# Patient Record
Sex: Female | Born: 2000 | Hispanic: Yes | Marital: Single | Smoking: Never smoker
Health system: Southern US, Community
[De-identification: ages and names within clinical notes are randomized; demographics above are authoritative.]

---
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
## 2022-02-14 ENCOUNTER — Emergency Department
Admission: EM | Admit: 2022-02-14 | Discharge: 2022-02-14 | Disposition: A | Discharge status: Home / Self Care | Payer: Medicaid Other | Attending: Emergency Medicine | Admitting: Emergency Medicine

## 2022-02-14 ENCOUNTER — Encounter: Payer: Self-pay | Admitting: Medical Oncology

## 2022-02-14 ENCOUNTER — Other Ambulatory Visit: Payer: Self-pay

## 2022-02-14 DIAGNOSIS — R112 Nausea with vomiting, unspecified: Secondary | ICD-10-CM | POA: Diagnosis present

## 2022-02-14 DIAGNOSIS — R1013 Epigastric pain: Secondary | ICD-10-CM | POA: Diagnosis not present

## 2022-02-14 LAB — URINALYSIS, ROUTINE W REFLEX MICROSCOPIC
Glucose, UA: NEGATIVE mg/dL
Ketones, ur: 20 mg/dL — AB
Nitrite: NEGATIVE
Protein, ur: 100 mg/dL — AB
Specific Gravity, Urine: 1.03 (ref 1.005–1.030)
pH: 7 (ref 5.0–8.0)

## 2022-02-14 LAB — COMPREHENSIVE METABOLIC PANEL
ALT: 14 U/L (ref 0–44)
AST: 25 U/L (ref 15–41)
Albumin: 5.7 g/dL — ABNORMAL HIGH (ref 3.5–5.0)
Alkaline Phosphatase: 77 U/L (ref 38–126)
Anion gap: 11 (ref 5–15)
BUN: 25 mg/dL — ABNORMAL HIGH (ref 6–20)
CO2: 31 mmol/L (ref 22–32)
Calcium: 10 mg/dL (ref 8.9–10.3)
Chloride: 95 mmol/L — ABNORMAL LOW (ref 98–111)
Creatinine, Ser: 0.81 mg/dL (ref 0.44–1.00)
GFR, Estimated: >60 mL/min (ref >60)
Glucose, Bld: 144 mg/dL — ABNORMAL HIGH (ref 70–99)
Potassium: 3.1 mmol/L — ABNORMAL LOW (ref 3.5–5.1)
Sodium: 137 mmol/L (ref 135–145)
Total Bilirubin: 0.8 mg/dL (ref 0.3–1.2)
Total Protein: 8.9 g/dL — ABNORMAL HIGH (ref 6.5–8.1)

## 2022-02-14 LAB — CBC
HCT: 46 % (ref 36.0–46.0)
Hemoglobin: 15.5 g/dL — ABNORMAL HIGH (ref 12.0–15.0)
MCH: 28.7 pg (ref 26.0–34.0)
MCHC: 33.7 g/dL (ref 30.0–36.0)
MCV: 85.2 fL (ref 80.0–100.0)
Platelets: 337 10*3/uL (ref 150–400)
RBC: 5.4 MIL/uL — ABNORMAL HIGH (ref 3.87–5.11)
RDW: 13.3 % (ref 11.5–15.5)
WBC: 8.8 10*3/uL (ref 4.0–10.5)
nRBC: 0 % (ref 0.0–0.2)

## 2022-02-14 LAB — PREGNANCY, URINE: Preg Test, Ur: NEGATIVE

## 2022-02-14 LAB — LIPASE, BLOOD: Lipase: 27 U/L (ref 11–51)

## 2022-02-14 MED ORDER — DIPHENHYDRAMINE HCL 50 MG/ML IJ SOLN
25.0000 mg | Freq: Once | INTRAMUSCULAR | Status: AC
  Administered 2022-02-14: 25 mg via INTRAVENOUS
  Filled 2022-02-14: qty 1

## 2022-02-14 MED ORDER — METOCLOPRAMIDE HCL 5 MG/ML IJ SOLN
10.0000 mg | Freq: Once | INTRAMUSCULAR | Status: AC
  Administered 2022-02-14: 10 mg via INTRAVENOUS
  Filled 2022-02-14: qty 2

## 2022-02-14 MED ORDER — ONDANSETRON 4 MG PO TBDP
4.0000 mg | ORAL_TABLET | Freq: Once | ORAL | Status: AC | PRN
  Administered 2022-02-14: 4 mg via ORAL
  Filled 2022-02-14: qty 1

## 2022-02-14 MED ORDER — SODIUM CHLORIDE 0.9 % IV BOLUS
1000.0000 mL | Freq: Once | INTRAVENOUS | Status: AC
  Administered 2022-02-14: 1000 mL via INTRAVENOUS

## 2022-02-14 MED ORDER — ONDANSETRON 4 MG PO TBDP: 4.0000 mg | ORAL_TABLET | Freq: Three times a day (TID) | ORAL | 0 refills | Status: AC | PRN

## 2022-02-14 NOTE — ED Provider Notes (Signed)
[facility] Provider Note    Event Date/Time   First MD Initiated Contact with Patient 02/14/22 [phone]     (approximate)  History   Chief Complaint: Vomiting  HPI  Kimberly Mccarty is a 21 y.o. female with no significant past medical history presents to the emergency department for nausea and vomiting.  According to the patient since Saturday she has been nauseated with frequent episodes of vomiting.  Patient states this has been an ongoing issue for her over the past 3 years intermittently.  Patient does admit to marijuana use although states she has been cutting back.  States mild epigastric discomfort which she relates to the vomiting.  No fever.  No urinary symptoms.  Physical Exam   Triage Vital Signs: ED Triage Vitals  Enc Vitals Group     BP 02/14/22 0921 (!) 132/97     Pulse Rate 02/14/22 0921 (!) 102     Resp 02/14/22 0921 17     Temp 02/14/22 0921 97.7 F (36.5 C)     Temp Source 02/14/22 0921 Oral     SpO2 02/14/22 0921 98 %     Weight 02/14/22 0922 85 lb (38.6 kg)     Height 02/14/22 0922 5' (1.524 m)     Head Circumference --      Peak Flow --      Pain Score 02/14/22 0921 0     Pain Loc --      Pain Edu? --      Excl. in [place]? --     Most recent vital signs: Vitals:   02/14/22 0921 02/14/22 0932  BP: (!) 132/97 (!) 126/99  Pulse: (!) 102   Resp: 17   Temp: 97.7 F (36.5 C) 98.2 F (36.8 C)  SpO2: 98%     General: Awake, no distress.  CV:  Good peripheral perfusion.  Regular rate and rhythm  Resp:  Normal effort.  Equal breath sounds bilaterally.  Abd:  No distention.  Soft, slight epigastric tenderness otherwise benign abdomen without rebound or guarding.   ED Results / Procedures / Treatments    MEDICATIONS ORDERED IN ED: Medications  sodium chloride 0.9 % bolus 1,000 mL (has no administration in time range)  metoCLOPramide (REGLAN) injection 10 mg (has no administration in time range)  diphenhydrAMINE (BENADRYL)  injection 25 mg (has no administration in time range)  ondansetron (ZOFRAN-ODT) disintegrating tablet 4 mg (4 mg Oral Given 02/14/22 0926)     IMPRESSION / MDM / ASSESSMENT AND PLAN / ED COURSE  I reviewed the triage vital signs and the nursing notes.  Patient's presentation is most consistent with acute presentation with potential threat to life or bodily function.  Patient presents emergency department for nausea and vomiting over the past 3 days.  Patient states a history of similar episodes intermittently over the past 3 years.  Patient does admit to occasional marijuana usage possibly indicating more of a cannabinoid induced or cyclical vomiting syndrome.  We will check labs we will continue to closely monitor in the emergency department.  We will treat with Reglan, IV fluids and Benadryl and reassess.  Patient agreeable to plan of care.  Patient's work-up is overall reassuring, reassuring CBC with a normal white blood cell count, reassuring chemistry, urinalysis does not appear consistent with UTI, pregnancy is negative, lipase is negative.  Patient is feeling better she has tolerated drinking fluids.  We will discharge patient with Zofran and have the patient follow-up with her  doctor.  FINAL CLINICAL IMPRESSION(S) / ED DIAGNOSES   Nausea and vomiting    Note:  This document was prepared using Dragon voice recognition software and may include unintentional dictation errors.   Harvest Dark, MD 02/14/22 1219

## 2022-02-14 NOTE — ED Triage Notes (Signed)
Pt reports that she has been having nausea and vomiting without any other sx's since Saturday.

## 2023-11-08 ENCOUNTER — Emergency Department
Admission: EM | Admit: 2023-11-08 | Discharge: 2023-11-08 | Disposition: A | Discharge status: Home / Self Care | Payer: Self-pay | Attending: Emergency Medicine | Admitting: Emergency Medicine

## 2023-11-08 ENCOUNTER — Encounter: Payer: Self-pay | Admitting: *Deleted

## 2023-11-08 ENCOUNTER — Other Ambulatory Visit: Payer: Self-pay

## 2023-11-08 DIAGNOSIS — E860 Dehydration: Secondary | ICD-10-CM | POA: Insufficient documentation

## 2023-11-08 DIAGNOSIS — R109 Unspecified abdominal pain: Secondary | ICD-10-CM | POA: Insufficient documentation

## 2023-11-08 DIAGNOSIS — R112 Nausea with vomiting, unspecified: Secondary | ICD-10-CM | POA: Insufficient documentation

## 2023-11-08 DIAGNOSIS — E876 Hypokalemia: Secondary | ICD-10-CM | POA: Insufficient documentation

## 2023-11-08 LAB — URINALYSIS, ROUTINE W REFLEX MICROSCOPIC
Bacteria, UA: NONE SEEN
Bilirubin Urine: NEGATIVE
Glucose, UA: NEGATIVE mg/dL
Hgb urine dipstick: NEGATIVE
Ketones, ur: 80 mg/dL — AB
Leukocytes,Ua: NEGATIVE
Nitrite: NEGATIVE
Protein, ur: 100 mg/dL — AB
Specific Gravity, Urine: 1.032 — ABNORMAL HIGH (ref 1.005–1.030)
pH: 5 (ref 5.0–8.0)

## 2023-11-08 LAB — CBC
HCT: 37.5 % (ref 36.0–46.0)
Hemoglobin: 12.8 g/dL (ref 12.0–15.0)
MCH: 29.4 pg (ref 26.0–34.0)
MCHC: 34.1 g/dL (ref 30.0–36.0)
MCV: 86 fL (ref 80.0–100.0)
Platelets: 251 10*3/uL (ref 150–400)
RBC: 4.36 MIL/uL (ref 3.87–5.11)
RDW: 13.4 % (ref 11.5–15.5)
WBC: 8.4 10*3/uL (ref 4.0–10.5)
nRBC: 0 % (ref 0.0–0.2)

## 2023-11-08 LAB — COMPREHENSIVE METABOLIC PANEL WITH GFR
ALT: 16 U/L (ref 0–44)
AST: 26 U/L (ref 15–41)
Albumin: 5.1 g/dL — ABNORMAL HIGH (ref 3.5–5.0)
Alkaline Phosphatase: 62 U/L (ref 38–126)
Anion gap: 13 (ref 5–15)
BUN: 16 mg/dL (ref 6–20)
CO2: 23 mmol/L (ref 22–32)
Calcium: 9.4 mg/dL (ref 8.9–10.3)
Chloride: 106 mmol/L (ref 98–111)
Creatinine, Ser: 0.68 mg/dL (ref 0.44–1.00)
GFR, Estimated: >60 mL/min (ref >60)
Glucose, Bld: 96 mg/dL (ref 70–99)
Potassium: 3.3 mmol/L — ABNORMAL LOW (ref 3.5–5.1)
Sodium: 142 mmol/L (ref 135–145)
Total Bilirubin: 1 mg/dL (ref 0.0–1.2)
Total Protein: 8 g/dL (ref 6.5–8.1)

## 2023-11-08 LAB — LIPASE, BLOOD: Lipase: 24 U/L (ref 11–51)

## 2023-11-08 LAB — POC URINE PREG, ED: Preg Test, Ur: NEGATIVE

## 2023-11-08 MED ORDER — DROPERIDOL 2.5 MG/ML IJ SOLN
1.2500 mg | Freq: Once | INTRAMUSCULAR | Status: AC
  Administered 2023-11-08: 1.25 mg via INTRAVENOUS
  Filled 2023-11-08: qty 2

## 2023-11-08 MED ORDER — LACTATED RINGERS IV BOLUS
2000.0000 mL | Freq: Once | INTRAVENOUS | Status: AC
  Administered 2023-11-08: 2000 mL via INTRAVENOUS

## 2023-11-08 MED ORDER — KETOROLAC TROMETHAMINE 30 MG/ML IJ SOLN
15.0000 mg | Freq: Once | INTRAMUSCULAR | Status: AC
  Administered 2023-11-08: 15 mg via INTRAVENOUS
  Filled 2023-11-08: qty 1

## 2023-11-08 NOTE — ED Provider Notes (Signed)
 [facility] Provider Note    Event Date/Time   First MD Initiated Contact with Patient 11/08/23 1938     (approximate)   History   Abdominal Pain   HPI  Kimberly Mccarty is a 23 y.o. female who presents to the ED for evaluation of Abdominal Pain   Patient presents with abdominal cramping in the setting of nausea and vomiting.  No fevers.  No history of abdominal surgeries.   Physical Exam   Triage Vital Signs: ED Triage Vitals  Encounter Vitals Group     BP 11/08/23 1702 115/72     Girls Systolic BP Percentile --      Girls Diastolic BP Percentile --      Boys Systolic BP Percentile --      Boys Diastolic BP Percentile --      Pulse Rate 11/08/23 1702 81     Resp 11/08/23 1702 16     Temp 11/08/23 1702 97.7 F (36.5 C)     Temp Source 11/08/23 1702 Oral     SpO2 11/08/23 1702 95 %     Weight 11/08/23 1711 115 lb (52.2 kg)     Height 11/08/23 1711 5' (1.524 m)     Head Circumference --      Peak Flow --      Pain Score 11/08/23 1711 8     Pain Loc --      Pain Education --      Exclude from Growth Chart --     Most recent vital signs: Vitals:   11/08/23 2200 11/08/23 2258  BP: 120/75 124/82  Pulse: 95 (!) 101  Resp: 16 18  Temp:  98 F (36.7 C)  SpO2: 100% 96%    General: Awake, no distress.  CV:  Good peripheral perfusion.  Resp:  Normal effort.  Abd:  No distention.  Soft and 9 without any tenderness, guarding or peritoneal features MSK:  No deformity noted.  Neuro:  No focal deficits appreciated. Other:     ED Results / Procedures / Treatments   Labs (all labs ordered are listed, but only abnormal results are displayed) Labs Reviewed  COMPREHENSIVE METABOLIC PANEL WITH GFR - Abnormal; Notable for the following components:      Result Value   Potassium 3.3 (*)    Albumin 5.1 (*)    All other components within normal limits  URINALYSIS, ROUTINE W REFLEX MICROSCOPIC - Abnormal; Notable for the following  components:   Color, Urine AMBER (*)    APPearance HAZY (*)    Specific Gravity, Urine 1.032 (*)    Ketones, ur 80 (*)    Protein, ur 100 (*)    All other components within normal limits  LIPASE, BLOOD  CBC  POC URINE PREG, ED    EKG   RADIOLOGY   Official radiology report(s): No results found.  PROCEDURES and INTERVENTIONS:  Procedures  Medications  lactated ringers  bolus 2,000 mL (0 mLs Intravenous Stopped 11/08/23 2258)  ketorolac (TORADOL) 30 MG/ML injection 15 mg (15 mg Intravenous Given 11/08/23 2023)  droperidol (INAPSINE) 2.5 MG/ML injection 1.25 mg (1.25 mg Intravenous Given 11/08/23 2024)     IMPRESSION / MDM / ASSESSMENT AND PLAN / ED COURSE  I reviewed the triage vital signs and the nursing notes.  Differential diagnosis includes, but is not limited to, acute appendicitis, biliary colic, pancreatitis, dehydration, cannabis hyperemesis or cyclic vomiting syndrome  {Patient presents with symptoms of an acute illness or injury that  is potentially life-threatening.  Patient presents with another episode of nausea and vomiting causing abdominal cramping.  Suspect a degree of cyclic vomiting or cannabis hyperemesis considering the recurrent nature of her ED visits for nausea and vomiting.  Many ketones in the urine suggestive of dehydration but generally reassuring blood work.  Marginal hypokalemia, normal CBC, normal lipase.  Benign exam.  Suitable for outpatient management.  Tolerating p.o. intake.      FINAL CLINICAL IMPRESSION(S) / ED DIAGNOSES   Final diagnoses:  Nausea and vomiting, unspecified vomiting type  Dehydration  Abdominal cramping     Rx / DC Orders   ED Discharge Orders     None        Note:  This document was prepared using Dragon voice recognition software and may include unintentional dictation errors.   Claudene Rover, MD 11/08/23 [phone]

## 2023-11-08 NOTE — ED Triage Notes (Signed)
 Pa ambulatory t o triage.  Pt has abd pain for  4 days.  Pt denies urinary sx.  No back pain.  No vag bleeding . No vag discharge.  Pt alert
